# Patient Record
Sex: Male | Born: 1980 | Race: White | Hispanic: No | Marital: Single | Smoking: Current some day smoker
Health system: Southern US, Community
[De-identification: ages and names within clinical notes are randomized; demographics above are authoritative.]

## PROBLEM LIST (undated history)

## (undated) DIAGNOSIS — N200 Calculus of kidney: Secondary | ICD-10-CM

---

## 2014-07-11 ENCOUNTER — Emergency Department (HOSPITAL_COMMUNITY): Payer: Self-pay

## 2014-07-11 ENCOUNTER — Encounter (HOSPITAL_COMMUNITY): Payer: Self-pay | Admitting: Nurse Practitioner

## 2014-07-11 ENCOUNTER — Emergency Department (HOSPITAL_COMMUNITY)
Admission: EM | Admit: 2014-07-11 | Discharge: 2014-07-11 | Disposition: A | Discharge status: Home / Self Care | Payer: Self-pay | Attending: Emergency Medicine | Admitting: Emergency Medicine

## 2014-07-11 DIAGNOSIS — R109 Unspecified abdominal pain: Secondary | ICD-10-CM

## 2014-07-11 DIAGNOSIS — N200 Calculus of kidney: Secondary | ICD-10-CM | POA: Insufficient documentation

## 2014-07-11 HISTORY — DX: Calculus of kidney: N20.0

## 2014-07-11 LAB — URINALYSIS, ROUTINE W REFLEX MICROSCOPIC
Bilirubin Urine: NEGATIVE
GLUCOSE, UA: NEGATIVE mg/dL
KETONES UR: 15 mg/dL — AB
Leukocytes, UA: NEGATIVE
Nitrite: NEGATIVE
PROTEIN: 30 mg/dL — AB
Specific Gravity, Urine: 1.039 — ABNORMAL HIGH (ref 1.005–1.030)
Urobilinogen, UA: 0.2 mg/dL (ref 0.0–1.0)
pH: 5.5 (ref 5.0–8.0)

## 2014-07-11 LAB — URINE MICROSCOPIC-ADD ON

## 2014-07-11 LAB — I-STAT CREATININE, ED: CREATININE: 1.2 mg/dL (ref 0.61–1.24)

## 2014-07-11 MED ORDER — KETOROLAC TROMETHAMINE 30 MG/ML IJ SOLN
30.0000 mg | Freq: Once | INTRAMUSCULAR | Status: AC
  Administered 2014-07-11: 30 mg via INTRAVENOUS
  Filled 2014-07-11: qty 1

## 2014-07-11 MED ORDER — OXYCODONE-ACETAMINOPHEN 5-325 MG PO TABS: 2.0000 | ORAL_TABLET | ORAL | Status: AC | PRN

## 2014-07-11 MED ORDER — TAMSULOSIN HCL 0.4 MG PO CAPS: 0.4000 mg | ORAL_CAPSULE | Freq: Every day | ORAL | Status: AC

## 2014-07-11 MED ORDER — ONDANSETRON HCL 4 MG/2ML IJ SOLN
4.0000 mg | Freq: Once | INTRAMUSCULAR | Status: AC
  Administered 2014-07-11: 4 mg via INTRAVENOUS
  Filled 2014-07-11: qty 2

## 2014-07-11 MED ORDER — OXYCODONE-ACETAMINOPHEN 5-325 MG PO TABS
1.0000 | ORAL_TABLET | Freq: Once | ORAL | Status: AC
  Administered 2014-07-11: 1 via ORAL
  Filled 2014-07-11: qty 1

## 2014-07-11 MED ORDER — FLUCONAZOLE 100 MG PO TABS
150.0000 mg | ORAL_TABLET | Freq: Once | ORAL | Status: AC
  Administered 2014-07-11: 150 mg via ORAL
  Filled 2014-07-11: qty 2

## 2014-07-11 NOTE — ED Notes (Signed)
Pt endorses left sided flank pain that woke him up from sleep this morning at 5 AM. Pt endorses severe 7-8/10 sharp, stabbing pain on left anterior abdomen that radiates to left back. Pt endorses nausea when pain is severe. Sts pain is worse when attempting to urinate. [facility]. Pt denies hematuria.

## 2014-07-11 NOTE — ED Provider Notes (Signed)
CSN: [id number]     Arrival date & time 07/11/14  0815 History   First MD Initiated Contact with Patient 07/11/14 [phone]     No chief complaint on file.    (Consider location/radiation/quality/duration/timing/severity/associated sxs/prior Treatment) HPI   34 year old male with past medical history significant for kidney stones who presents with acute onset of left flank pain. Patient reported he was awoke this morning at roughly 5 AM with sharp stabbing pain to his left abdomen and radiates to his left flank. Pain is colicky, waxing and waning, nothing seems to make it better or worse. The nausea when the pain is severe without vomiting. Increasing pain with urinating. He tried taking BC powder with minimal relief. Denies any fever, chills, chest pain, shortness of breath, productive cough, vomiting, diarrhea, dysuria, hematuria, hematochezia or melanoma. Denies any strenuous activities. State pain felt very similar to prior kidney stones, last episode was 4 years ago. No prior history of renal stenting or lithotripsy. Patient does admits to drinking soda on a daily basis.  No past medical history on file. No past surgical history on file. No family history on file. History  Substance Use Topics  . Smoking status: Not on file  . Smokeless tobacco: Not on file  . Alcohol Use: Not on file    Review of Systems  All other systems reviewed and are negative.     Allergies  Review of patient's allergies indicates not on file.  Home Medications   Prior to Admission medications   Not on File   BP 139/92 mmHg  Pulse 57  Temp(Src) 97.6 F (36.4 C) (Oral)  Resp 20  SpO2 100% Physical Exam  Constitutional: He appears well-developed and well-nourished. No distress.  HENT:  Head: Atraumatic.  Eyes: Conjunctivae are normal.  Neck: Neck supple.  Cardiovascular: Normal rate and regular rhythm.   Pulmonary/Chest: Effort normal and breath sounds normal.  Abdominal: Soft. Bowel sounds are  normal. He exhibits no distension. There is no tenderness.  Genitourinary:  Left CVA tenderness on percussion.  Neurological: He is alert.  Skin: No rash noted.  Psychiatric: He has a normal mood and affect.  Nursing note and vitals reviewed.   ED Course  Procedures (including critical care time)  Patient with history of kidney stones here with pain suggestive of kidney stone. Workup initiated.  11:11 AM Urine shows moderate amount of hemoglobin but no signs of infection aside from a few yeast. Give Diflucan. Renal function appears normal. Renal stone study demonstrate a 3 mm calcified obstructive calculi at the UPJ. Mild left hydronephrosis and proximal left hydroureter. Patient will be discharge with pain medication, Flomax, and outpatient follow-up with Urologist.    Labs Review Labs Reviewed  URINALYSIS, ROUTINE W REFLEX MICROSCOPIC (NOT AT [facility]) - Abnormal; Notable for the following:    APPearance TURBID (*)    Specific Gravity, Urine 1.039 (*)    Hgb urine dipstick LARGE (*)    Ketones, ur 15 (*)    Protein, ur 30 (*)    All other components within normal limits  URINE MICROSCOPIC-ADD ON - Abnormal; Notable for the following:    Bacteria, UA FEW (*)    Casts GRANULAR CAST (*)    All other components within normal limits  I-STAT CREATININE, ED    Imaging Review Ct Renal Stone Study  07/11/2014   CLINICAL DATA:  Left flank pain left back pain  EXAM: CT ABDOMEN AND PELVIS WITHOUT CONTRAST  TECHNIQUE: Multidetector CT imaging of the abdomen  and pelvis was performed following the standard protocol without IV contrast.  COMPARISON:  None.  FINDINGS: Lung bases are unremarkable. Sagittal images of the spine are unremarkable. Unenhanced liver shows no biliary ductal dilatation. No calcified gallstones are noted within gallbladder. Unenhanced pancreas, spleen and adrenal glands are unremarkable. There is mild left hydronephrosis and proximal left hydroureter. Nonobstructive calcified  calculus in midpole of the right kidney measures 1.7 mm. There is nonobstructive calcified calculus in midpole of the left kidney measures 2 mm. Axial image 41 there is 3 mm calcified obstructive calculus in proximal left ureter about 1 cm inferior to UPJ. Bilateral distal ureter is unremarkable. Urinary bladder is under distended. Prostate gland and seminal vesicles are unremarkable.  No small bowel obstruction. No ascites or free air. No adenopathy. No pericecal inflammation. Normal retrocecal appendix. Terminal ileum is unremarkable. Pelvic phleboliths are noted. No inguinal adenopathy. No destructive bony lesions are noted within pelvis. Moderate stool noted within cecum.  IMPRESSION: 1. There is mild left hydronephrosis and proximal left hydroureter. 2. There is 3 mm calcified obstructive calculus in proximal left ureter about 1 cm below UPJ. 3. Bilateral nonobstructive nephrolithiasis. No right hydronephrosis. 4. No pericecal inflammation. Normal appendix. Moderate stool noted within cecum. 5. No calcified calculi are noted within under distended urinary bladder.   Electronically Signed   By: Natasha Mead M.D.   On: 07/11/2014 10:00     EKG Interpretation None      MDM   Final diagnoses:  Left flank pain  Kidney stone on left side    BP 117/75 mmHg  Pulse 55  Temp(Src) 97.8 F (36.6 C) (Oral)  Resp 16  SpO2 99%  I have reviewed nursing notes and vital signs. I personally reviewed the imaging tests through PACS system  I reviewed available ER/hospitalization records thought the EMR     Fayrene Helper, PA-C 07/12/14 1503  Jerelyn Scott, MD 07/12/14 1505

## 2014-07-11 NOTE — Discharge Instructions (Signed)
You have a 3mm kidney stone on the left side.  Please use urine strainer to capture the stone when urinating and bring it to the urologist for further evaluation.   Kidney Stones Kidney stones (urolithiasis) are deposits that form inside your kidneys. The intense pain is caused by the stone moving through the urinary tract. When the stone moves, the ureter goes into spasm around the stone. The stone is usually passed in the urine.  CAUSES   A disorder that makes certain neck glands produce too much parathyroid hormone (primary hyperparathyroidism).  A buildup of uric acid crystals, similar to gout in your joints.  Narrowing (stricture) of the ureter.  A kidney obstruction present at birth (congenital obstruction).  Previous surgery on the kidney or ureters.  Numerous kidney infections. SYMPTOMS   Feeling sick to your stomach (nauseous).  Throwing up (vomiting).  Blood in the urine (hematuria).  Pain that usually spreads (radiates) to the groin.  Frequency or urgency of urination. DIAGNOSIS   Taking a history and physical exam.  Blood or urine tests.  CT scan.  Occasionally, an examination of the inside of the urinary bladder (cystoscopy) is performed. TREATMENT   Observation.  Increasing your fluid intake.  Extracorporeal shock wave lithotripsy--This is a noninvasive procedure that uses shock waves to break up kidney stones.  Surgery may be needed if you have severe pain or persistent obstruction. There are various surgical procedures. Most of the procedures are performed with the use of small instruments. Only small incisions are needed to accommodate these instruments, so recovery time is minimized. The size, location, and chemical composition are all important variables that will determine the proper choice of action for you. Talk to your health care provider to better understand your situation so that you will minimize the risk of injury to yourself and your kidney.    HOME CARE INSTRUCTIONS   Drink enough water and fluids to keep your urine clear or pale yellow. This will help you to pass the stone or stone fragments.  Strain all urine through the provided strainer. Keep all particulate matter and stones for your health care provider to see. The stone causing the pain may be as small as a grain of salt. It is very important to use the strainer each and every time you pass your urine. The collection of your stone will allow your health care provider to analyze it and verify that a stone has actually passed. The stone analysis will often identify what you can do to reduce the incidence of recurrences.  Only take over-the-counter or prescription medicines for pain, discomfort, or fever as directed by your health care provider.  Make a follow-up appointment with your health care provider as directed.  Get follow-up X-rays if required. The absence of pain does not always mean that the stone has passed. It may have only stopped moving. If the urine remains completely obstructed, it can cause loss of kidney function or even complete destruction of the kidney. It is your responsibility to make sure X-rays and follow-ups are completed. Ultrasounds of the kidney can show blockages and the status of the kidney. Ultrasounds are not associated with any radiation and can be performed easily in a matter of minutes. SEEK MEDICAL CARE IF:  You experience pain that is progressive and unresponsive to any pain medicine you have been prescribed. SEEK IMMEDIATE MEDICAL CARE IF:   Pain cannot be controlled with the prescribed medicine.  You have a fever or shaking chills.  The severity or intensity of pain increases over 18 hours and is not relieved by pain medicine.  You develop a new onset of abdominal pain.  You feel faint or pass out.  You are unable to urinate. MAKE SURE YOU:   Understand these instructions.  Will watch your condition.  Will get help right away  if you are not doing well or get worse. Document Released: 02/01/2005 Document Revised: 10/04/2012 Document Reviewed: 07/05/2012 [facility] Patient Information 2015 [place], [place]. This information is not intended to replace advice given to you by your health care provider. Make sure you discuss any questions you have with your health care provider.

## 2016-10-12 IMAGING — CT CT RENAL STONE PROTOCOL
2 of 4 series · 16 of 46 positions shown, 18 images · non-contrast
Comparison: None.

CLINICAL DATA: Left flank pain left back pain

EXAM:
CT ABDOMEN AND PELVIS WITHOUT CONTRAST
TECHNIQUE: Multidetector CT imaging of the abdomen and pelvis was performed
following the standard protocol without IV contrast.

[Series 2: stone study 5.0 i30f 1 · axial · 0.75mm/px · z∈[+775,+1210]mm · 13 of 95 slices shown, 15 images]
[im 4/95  soft-tissue]
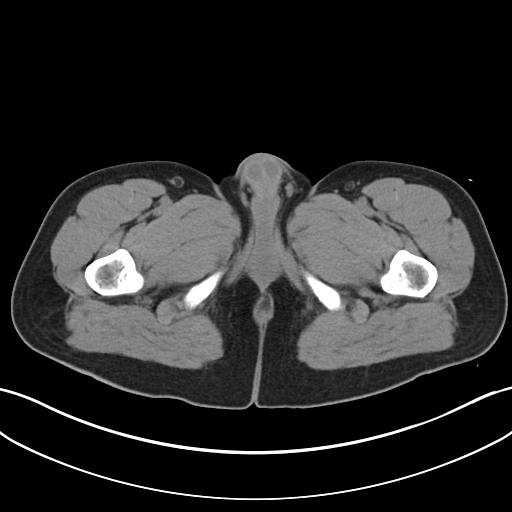
[im 4/95  bone]
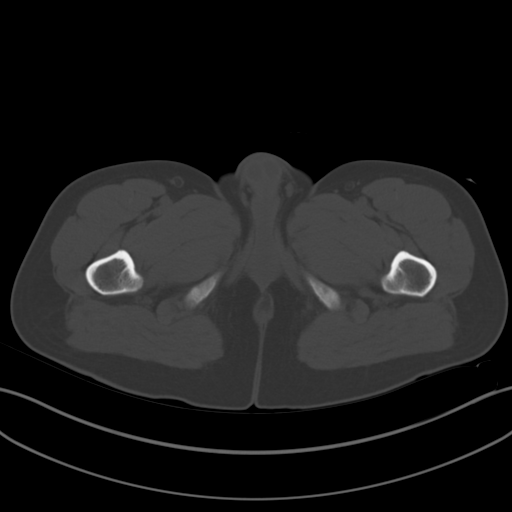
[im 12/95  soft-tissue]
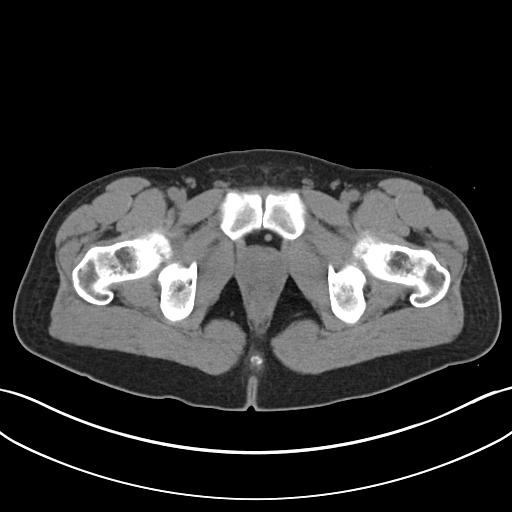
[im 20/95  soft-tissue]
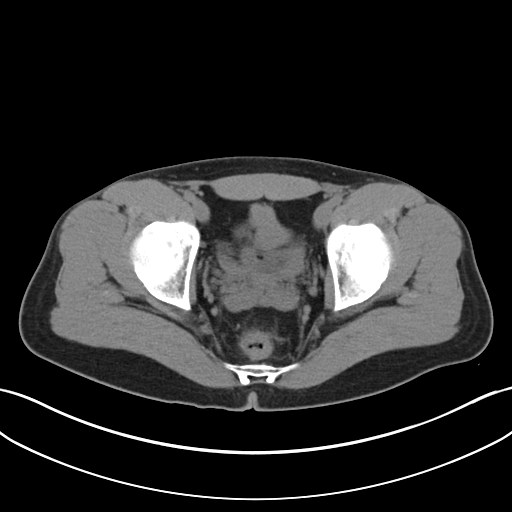
[im 28/95  soft-tissue]
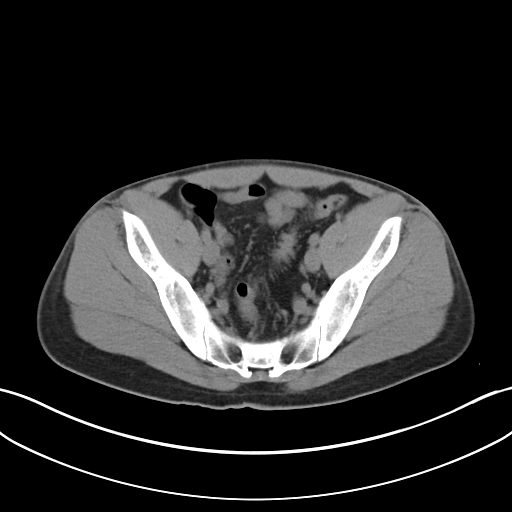
[im 32/95  soft-tissue]
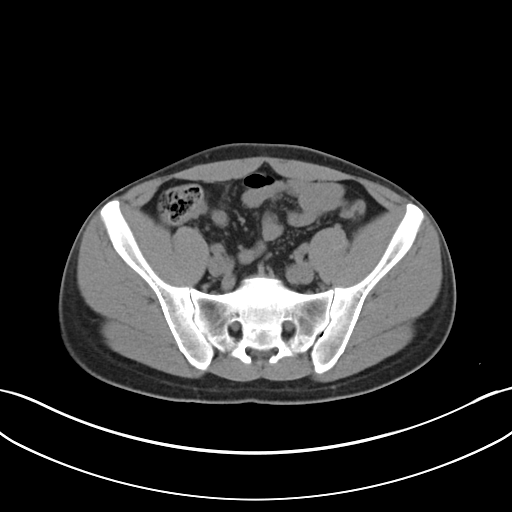
[im 40/95  soft-tissue]
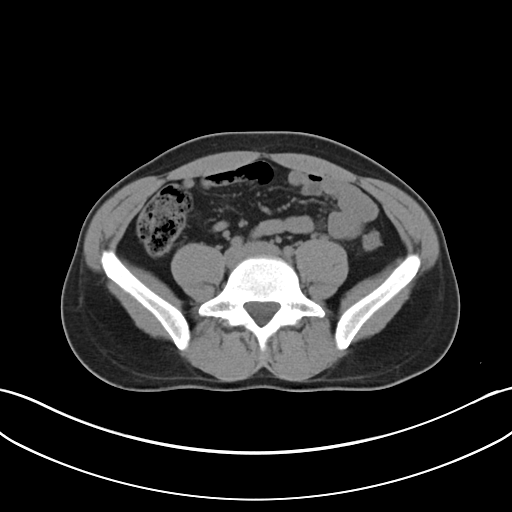
[im 48/95  soft-tissue]
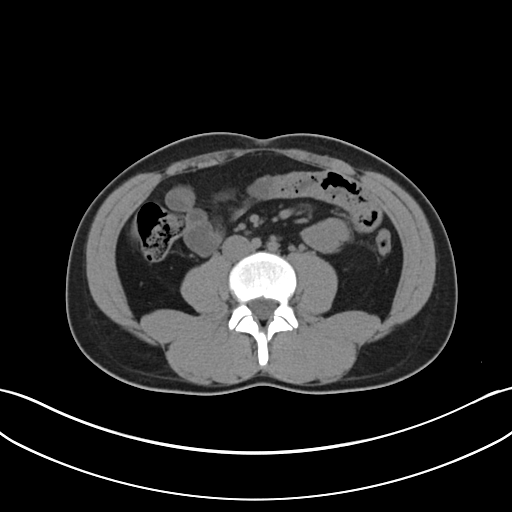
[im 55/95  soft-tissue]
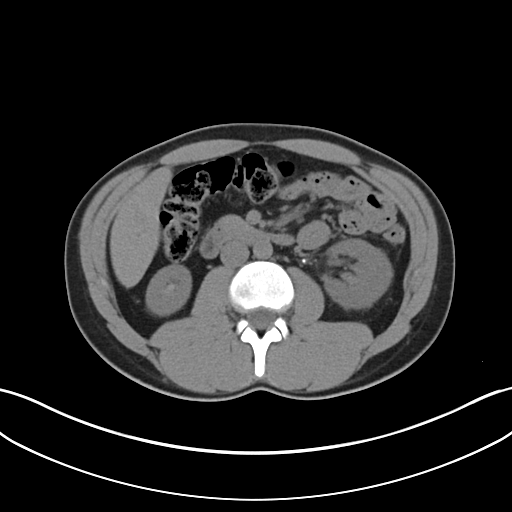
[im 63/95  soft-tissue]
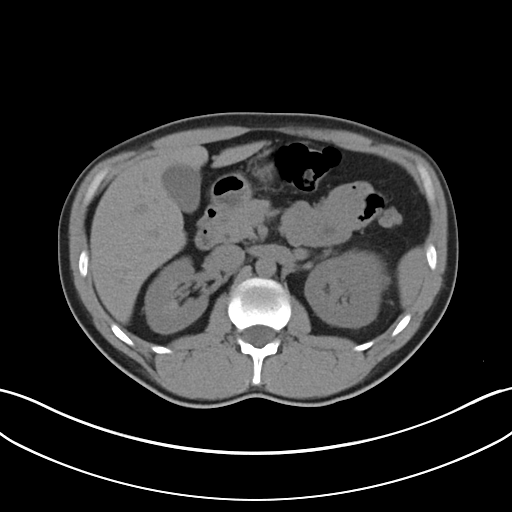
[im 63/95  bone]
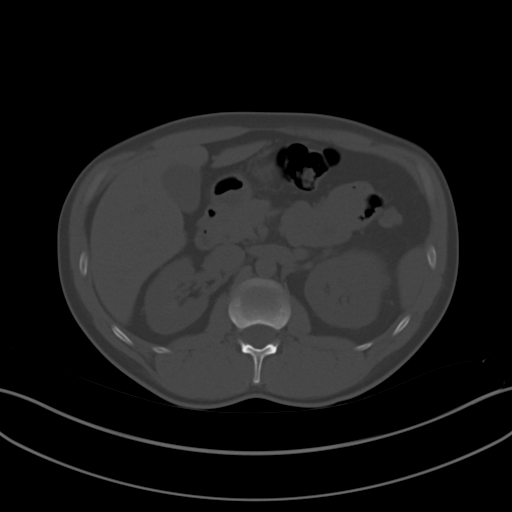
[im 67/95  soft-tissue]
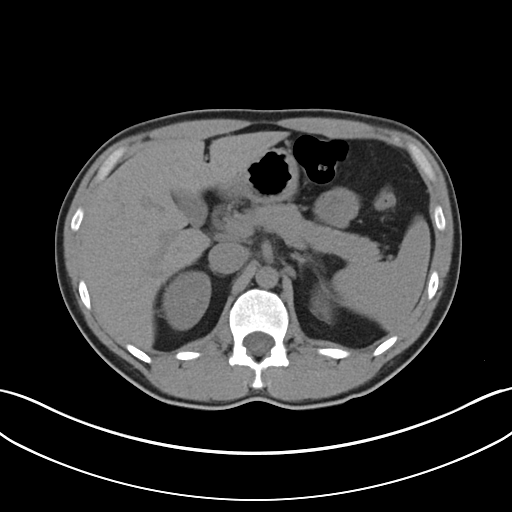
[im 75/95  soft-tissue]
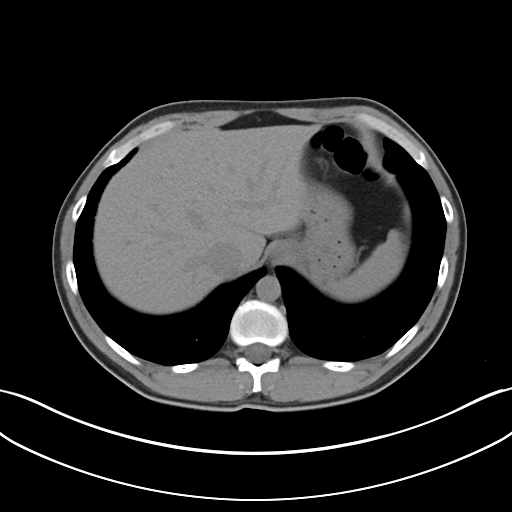
[im 83/95  soft-tissue]
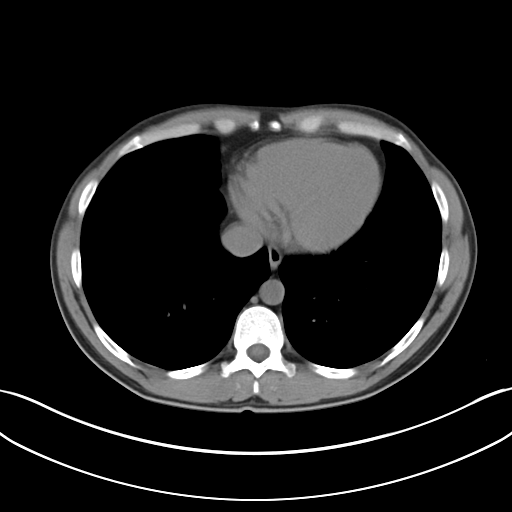
[im 91/95  soft-tissue]
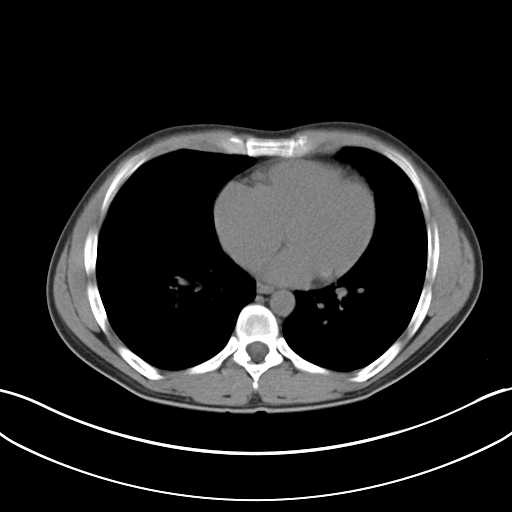

[Series 5: coronal soft tissue · coronal · 0.69mm/px · 3 of 81 slices shown]
[im 27/81  soft-tissue]
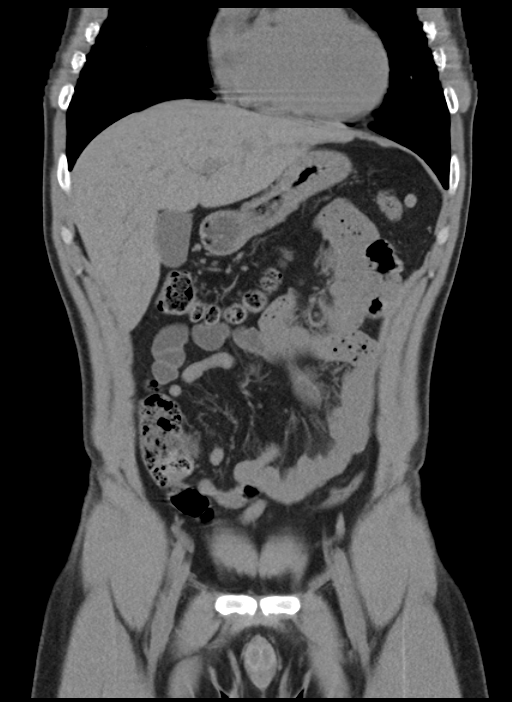
[im 36/81  soft-tissue]
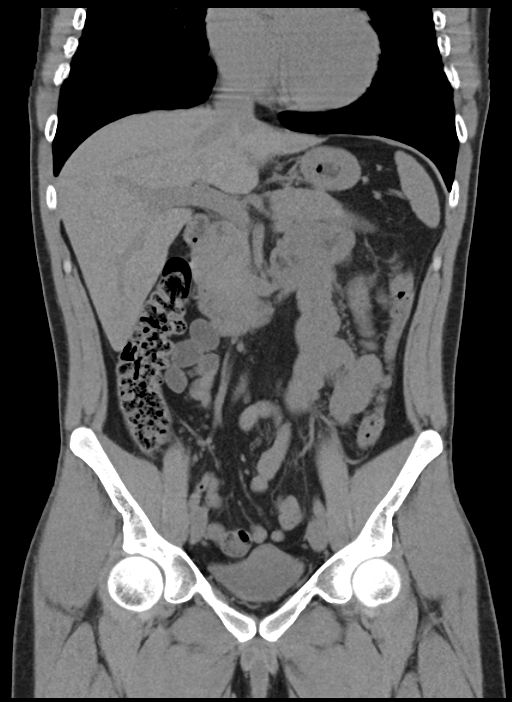
[im 45/81  soft-tissue]
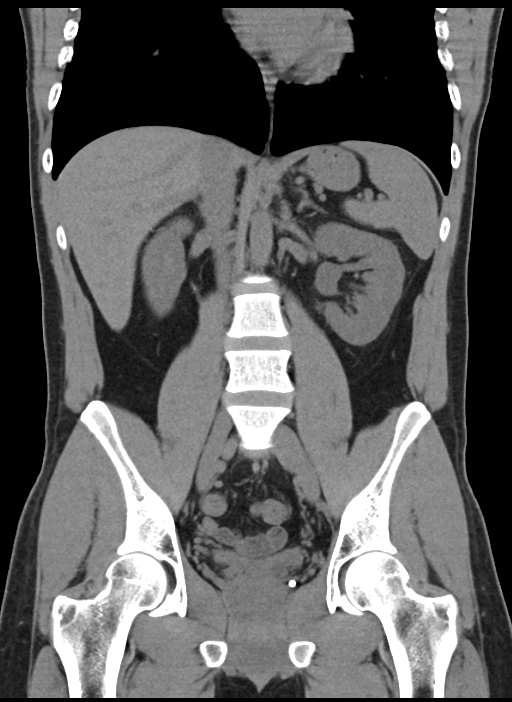

[16 of 46 positions shown; findings below may reference images not displayed]

FINDINGS: Lung bases are unremarkable. Sagittal images of the spine are
unremarkable. Unenhanced liver shows no biliary ductal dilatation.
No calcified gallstones are noted within gallbladder. Unenhanced
pancreas, spleen and adrenal glands are unremarkable. There is mild
left hydronephrosis and proximal left hydroureter. Nonobstructive
calcified calculus in midpole of the right kidney measures 1.7 mm.
There is nonobstructive calcified calculus in midpole of the left
kidney measures 2 mm. Axial image 41 there is 3 mm calcified
obstructive calculus in proximal left ureter about 1 cm inferior to
UPJ. Bilateral distal ureter is unremarkable. Urinary bladder is
under distended. Prostate gland and seminal vesicles are
unremarkable.

No small bowel obstruction. No ascites or free air. No adenopathy.
No pericecal inflammation. Normal retrocecal appendix. Terminal
ileum is unremarkable. Pelvic phleboliths are noted. No inguinal
adenopathy. No destructive bony lesions are noted within pelvis.
Moderate stool noted within cecum.
IMPRESSION: 1. There is mild left hydronephrosis and proximal left hydroureter.
2. There is 3 mm calcified obstructive calculus in proximal left
ureter about 1 cm below UPJ.
3. Bilateral nonobstructive nephrolithiasis. No right
hydronephrosis.
4. No pericecal inflammation. Normal appendix. Moderate stool noted
within cecum.
5. No calcified calculi are noted within under distended urinary
bladder.

## 2020-05-20 ENCOUNTER — Telehealth: Payer: Self-pay | Admitting: Nutrition

## 2020-05-20 NOTE — Telephone Encounter (Signed)
TC from patient to set up appointment. Pt. Notes his blood sugars are running 400 plus after  He eats. Just started on Lantus 25 units a day. Reviewed MY plate briefly and send handouts via email. Advised to call Dr. Reuel Boom if BS are over 400's x 3 or go to ER for fluids and possible insulin IV to get BS down. Appointment scheduled for Monday 05-26-20 at 330 pm. Record BS x 4 per day and bring readings/meter to visit. Drink 100 oz of water daily. He verbalized understanding. Encouraged him to talk to Dr. Reuel Boom about referral to Endocrinologist.

## 2020-05-26 ENCOUNTER — Ambulatory Visit: Payer: Self-pay | Admitting: Nutrition

## 2021-04-26 ENCOUNTER — Other Ambulatory Visit: Payer: Self-pay

## 2021-04-26 ENCOUNTER — Emergency Department (HOSPITAL_COMMUNITY)
Admission: EM | Admit: 2021-04-26 | Discharge: 2021-04-26 | Disposition: A | Discharge status: Home / Self Care | Payer: 59 | Attending: Emergency Medicine | Admitting: Emergency Medicine

## 2021-04-26 DIAGNOSIS — R42 Dizziness and giddiness: Secondary | ICD-10-CM | POA: Diagnosis not present

## 2021-04-26 DIAGNOSIS — R519 Headache, unspecified: Secondary | ICD-10-CM | POA: Diagnosis not present

## 2021-04-26 DIAGNOSIS — G8918 Other acute postprocedural pain: Secondary | ICD-10-CM | POA: Insufficient documentation

## 2021-04-26 DIAGNOSIS — R110 Nausea: Secondary | ICD-10-CM | POA: Insufficient documentation

## 2021-04-26 DIAGNOSIS — R509 Fever, unspecified: Secondary | ICD-10-CM | POA: Insufficient documentation

## 2021-04-26 LAB — CBC WITH DIFFERENTIAL/PLATELET
Abs Immature Granulocytes: 0.01 10*3/uL (ref 0.00–0.07)
Basophils Absolute: 0.1 10*3/uL (ref 0.0–0.1)
Basophils Relative: 1 %
Eosinophils Absolute: 0.7 10*3/uL — ABNORMAL HIGH (ref 0.0–0.5)
Eosinophils Relative: 8 %
HCT: 48.6 % (ref 39.0–52.0)
Hemoglobin: 16.3 g/dL (ref 13.0–17.0)
Immature Granulocytes: 0 %
Lymphocytes Relative: 23 %
Lymphs Abs: 2.1 10*3/uL (ref 0.7–4.0)
MCH: 30.9 pg (ref 26.0–34.0)
MCHC: 33.5 g/dL (ref 30.0–36.0)
MCV: 92 fL (ref 80.0–100.0)
Monocytes Absolute: 1 10*3/uL (ref 0.1–1.0)
Monocytes Relative: 10 %
Neutro Abs: 5.5 10*3/uL (ref 1.7–7.7)
Neutrophils Relative %: 58 %
Platelets: 240 10*3/uL (ref 150–400)
RBC: 5.28 MIL/uL (ref 4.22–5.81)
RDW: 11.9 % (ref 11.5–15.5)
WBC: 9.3 10*3/uL (ref 4.0–10.5)
nRBC: 0 % (ref 0.0–0.2)

## 2021-04-26 LAB — COMPREHENSIVE METABOLIC PANEL
ALT: 31 U/L (ref 0–44)
AST: 24 U/L (ref 15–41)
Albumin: 4.1 g/dL (ref 3.5–5.0)
Alkaline Phosphatase: 57 U/L (ref 38–126)
Anion gap: 11 (ref 5–15)
BUN: 9 mg/dL (ref 6–20)
CO2: 25 mmol/L (ref 22–32)
Calcium: 9.2 mg/dL (ref 8.9–10.3)
Chloride: 101 mmol/L (ref 98–111)
Creatinine, Ser: 1.08 mg/dL (ref 0.61–1.24)
GFR, Estimated: >60 mL/min (ref >60)
Glucose, Bld: 110 mg/dL — ABNORMAL HIGH (ref 70–99)
Potassium: 3.8 mmol/L (ref 3.5–5.1)
Sodium: 137 mmol/L (ref 135–145)
Total Bilirubin: 1 mg/dL (ref 0.3–1.2)
Total Protein: 6.8 g/dL (ref 6.5–8.1)

## 2021-04-26 LAB — LACTIC ACID, PLASMA: Lactic Acid, Venous: 0.8 mmol/L (ref 0.5–1.9)

## 2021-04-26 MED ORDER — SODIUM CHLORIDE 0.9 % IV BOLUS
1000.0000 mL | Freq: Once | INTRAVENOUS | Status: AC
  Administered 2021-04-26: 1000 mL via INTRAVENOUS

## 2021-04-26 MED ORDER — PROCHLORPERAZINE MALEATE 10 MG PO TABS: 10.0000 mg | ORAL_TABLET | Freq: Three times a day (TID) | ORAL | 0 refills | Status: AC | PRN

## 2021-04-26 MED ORDER — PROCHLORPERAZINE EDISYLATE 10 MG/2ML IJ SOLN
5.0000 mg | Freq: Once | INTRAMUSCULAR | Status: AC
  Administered 2021-04-26: 5 mg via INTRAVENOUS
  Filled 2021-04-26: qty 2

## 2021-04-26 MED ORDER — ONDANSETRON HCL 4 MG/2ML IJ SOLN
4.0000 mg | Freq: Once | INTRAMUSCULAR | Status: AC
  Administered 2021-04-26: 4 mg via INTRAVENOUS
  Filled 2021-04-26: qty 2

## 2021-04-26 MED ORDER — KETOROLAC TROMETHAMINE 30 MG/ML IJ SOLN
15.0000 mg | Freq: Once | INTRAMUSCULAR | Status: AC
  Administered 2021-04-26: 15 mg via INTRAVENOUS
  Filled 2021-04-26: qty 1

## 2021-04-26 MED ORDER — FENTANYL CITRATE PF 50 MCG/ML IJ SOSY
50.0000 ug | PREFILLED_SYRINGE | Freq: Once | INTRAMUSCULAR | Status: AC
  Administered 2021-04-26: 50 ug via INTRAVENOUS
  Filled 2021-04-26: qty 1

## 2021-04-26 NOTE — ED Provider Notes (Signed)
?[facility] EMERGENCY DEPARTMENT ?Provider Note ? ? ?CSN: [id number] ?Arrival date & time: 04/26/21  1314 ? ?  ? ?History ? ?Chief Complaint  ?Patient presents with  ? Fever  ? Nausea  ? Dental Pain  ? ? ?Samuel Keller is a 41 y.o. male. ? ? ?Fever ?Associated symptoms: headaches   ?Dental Pain ?Associated symptoms: fever and headaches   ?Patient recently had left upper posterior teeth pulled.  Done down in [place].  States that was on pain medicines and antibiotics.  States today feeling worse.  Headache.  Some nausea.  Has had fevers and chills.  Hydrocodone had been given it helped but symptoms return.  Feeling bad all over.  Feels a little dizzy.  No known sick contacts.  Some bleeding in his mouth.  States there was an infection in the teeth when they were pulled. ?  ? ?Home Medications ?Prior to Admission medications   ?Medication Sig Start Date End Date Taking? Authorizing Provider  ?prochlorperazine (COMPAZINE) 10 MG tablet Take 1 tablet (10 mg total) by mouth every 8 (eight) hours as needed for nausea or vomiting. 04/26/21  Yes Benjiman Core, MD  ?oxyCODONE-acetaminophen (PERCOCET/ROXICET) 5-325 MG per tablet Take 2 tablets by mouth every 4 (four) hours as needed for severe pain. 07/11/14   Fayrene Helper, PA-C  ?tamsulosin (FLOMAX) 0.4 MG CAPS capsule Take 1 capsule (0.4 mg total) by mouth daily. 07/11/14   Fayrene Helper, PA-C  ?   ? ?Allergies    ?Patient has no known allergies.   ? ?Review of Systems   ?Review of Systems  ?Constitutional:  Positive for fever.  ?Respiratory:  Negative for choking.   ?Gastrointestinal:  Negative for abdominal pain.  ?Musculoskeletal:  Negative for back pain.  ?Neurological:  Positive for dizziness and headaches. Negative for weakness.  ? ?Physical Exam ?Updated Vital Signs ?BP 118/71   Pulse 86   Temp 98.6 ?F (37 ?C) (Oral)   Resp 17   SpO2 100%  ?Physical Exam ?Vitals and nursing note reviewed.  ?HENT:  ?   Head: Atraumatic.  ?   Comments: Left to  posterior upper teeth had been pulled.  No tenderness over jaw.  Some mild oozing of blood at the site. ?Cardiovascular:  ?   Rate and Rhythm: Regular rhythm.  ?Abdominal:  ?   Tenderness: There is no abdominal tenderness.  ?Musculoskeletal:  ?   Cervical back: Neck supple.  ?Skin: ?   General: Skin is warm.  ?   Capillary Refill: Capillary refill takes less than 2 seconds.  ?Neurological:  ?   Mental Status: He is alert and oriented to person, place, and time.  ? ? ?ED Results / Procedures / Treatments   ?Labs ?(all labs ordered are listed, but only abnormal results are displayed) ?Labs Reviewed  ?COMPREHENSIVE METABOLIC PANEL - Abnormal; Notable for the following components:  ?    Result Value  ? Glucose, Bld 110 (*)   ? All other components within normal limits  ?CBC WITH DIFFERENTIAL/PLATELET - Abnormal; Notable for the following components:  ? Eosinophils Absolute 0.7 (*)   ? All other components within normal limits  ?CULTURE, BLOOD (ROUTINE X 2)  ?CULTURE, BLOOD (ROUTINE X 2)  ?LACTIC ACID, PLASMA  ?LACTIC ACID, PLASMA  ?URINALYSIS, ROUTINE W REFLEX MICROSCOPIC  ? ? ?EKG ?None ? ?Radiology ?No results found. ? ?Procedures ?Procedures  ? ? ?Medications Ordered in ED ?Medications  ?sodium chloride 0.9 % bolus 1,000 mL (0 mLs Intravenous Stopped  04/26/21 1638)  ?ketorolac (TORADOL) 30 MG/ML injection 15 mg (15 mg Intravenous Given 04/26/21 1439)  ?ondansetron (ZOFRAN) injection 4 mg (4 mg Intravenous Given 04/26/21 1436)  ?fentaNYL (SUBLIMAZE) injection 50 mcg (50 mcg Intravenous Given 04/26/21 1543)  ?sodium chloride 0.9 % bolus 1,000 mL (0 mLs Intravenous Stopped 04/26/21 1850)  ?prochlorperazine (COMPAZINE) injection 5 mg (5 mg Intravenous Given 04/26/21 1753)  ? ? ?ED Course/ Medical Decision Making/ A&P ?  ?                        ?Medical Decision Making ?Amount and/or Complexity of Data Reviewed ?Labs: ordered. ? ?Risk ?Prescription drug management. ? ? ?Patient presents with pain.  Chills.  Recent dental  extraction.  Has headache.  Nonfocal exam.  Normal white count.  Blood cultures done due to chance of bacteremia with recent procedure however is on antibiotics.  White count reassuring.  Normal lactic acid.  Do not feel as if I need intracranial imaging at this time.  Feels better after treatment.  Fluids been given initially Toradol and later Compazine for the headache.  Feels much better.  Will discharge home.  We will follow-up with dentist as needed.  Discussed follow-up instructions.  Discharged home. ? ? ? ? ? ? ? ? ?Final Clinical Impression(s) / ED Diagnoses ?Final diagnoses:  ?Postoperative pain  ? ? ?Rx / DC Orders ?ED Discharge Orders   ? ?      Ordered  ?  prochlorperazine (COMPAZINE) 10 MG tablet  Every 8 hours PRN       ? 04/26/21 1842  ? ?  ?  ? ?  ? ? ?  ?Benjiman Core, MD ?04/26/21 2045 ? ?

## 2021-04-26 NOTE — ED Notes (Signed)
Blood culture number one drawn via iv start, blood culture number two drawn via 21 gauge butterfly from L hand. Pt tolerated well  ?

## 2021-04-26 NOTE — ED Triage Notes (Signed)
Pt here for nausea, fever and chills since Friday night after having an infected tooth pulled that afternoon. Pt states hes has woken up in cold sweats. Pt has been taking amoxicillin, ibuprofen and took half a tablet of a hydrocodone this morning.  ?

## 2021-04-26 NOTE — ED Notes (Signed)
Crackers and water/juice given for po challenge, pt reports decreased pain, states that he is feeling much better ?

## 2021-04-26 NOTE — Discharge Instructions (Addendum)
Use your pain medicines to help.  Watch for persistent fevers.  If your blood cultures come back positive we will let you know.  The Compazine may help with you develop more headaches. ?

## 2021-05-01 LAB — CULTURE, BLOOD (ROUTINE X 2)
Culture: NO GROWTH
Culture: NO GROWTH
Special Requests: ADEQUATE
Special Requests: ADEQUATE
